# Patient Record
Sex: Male | Born: 1990 | Race: Black or African American | Hispanic: No | Marital: Single | Smoking: Current some day smoker
Health system: Southern US, Community
[De-identification: ages and names within clinical notes are randomized; demographics above are authoritative.]

## PROBLEM LIST (undated history)

## (undated) HISTORY — PX: CIRCUMCISION: SUR203

---
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
## 2010-12-03 ENCOUNTER — Emergency Department (HOSPITAL_COMMUNITY)
Admission: EM | Admit: 2010-12-03 | Discharge: 2010-12-03 | Disposition: A | Discharge status: Home / Self Care | Payer: No Typology Code available for payment source | Attending: Emergency Medicine | Admitting: Emergency Medicine

## 2010-12-03 DIAGNOSIS — M545 Low back pain, unspecified: Secondary | ICD-10-CM | POA: Insufficient documentation

## 2010-12-03 DIAGNOSIS — S335XXA Sprain of ligaments of lumbar spine, initial encounter: Secondary | ICD-10-CM | POA: Insufficient documentation

## 2011-05-30 LAB — DIFFERENTIAL
Basophils Absolute: 0
Basophils Relative: 0
Eosinophils Absolute: 0
Monocytes Relative: 8
Neutro Abs: 7.3 — ABNORMAL HIGH
Neutrophils Relative %: 74 — ABNORMAL HIGH

## 2011-05-30 LAB — POCT I-STAT CREATININE: Creatinine, Ser: 1.5

## 2011-05-30 LAB — CBC
MCHC: 33.2
MCV: 95.9
Platelets: 266
RDW: 13

## 2011-05-30 LAB — I-STAT 8, (EC8 V) (CONVERTED LAB)
Acid-Base Excess: 2
Bicarbonate: 25.8 — ABNORMAL HIGH
Chloride: 104
HCT: 45
Operator id: 272551
pCO2, Ven: 36.9 — ABNORMAL LOW
pH, Ven: 7.452 — ABNORMAL HIGH

## 2012-05-20 ENCOUNTER — Encounter (HOSPITAL_COMMUNITY): Payer: Self-pay | Admitting: Emergency Medicine

## 2012-05-20 ENCOUNTER — Emergency Department (HOSPITAL_COMMUNITY): Payer: BC Managed Care – PPO

## 2012-05-20 ENCOUNTER — Emergency Department (HOSPITAL_COMMUNITY)
Admission: EM | Admit: 2012-05-20 | Discharge: 2012-05-21 | Disposition: A | Discharge status: Home / Self Care | Payer: BC Managed Care – PPO | Attending: Emergency Medicine | Admitting: Emergency Medicine

## 2012-05-20 DIAGNOSIS — S8392XA Sprain of unspecified site of left knee, initial encounter: Secondary | ICD-10-CM

## 2012-05-20 DIAGNOSIS — Y92838 Other recreation area as the place of occurrence of the external cause: Secondary | ICD-10-CM | POA: Insufficient documentation

## 2012-05-20 DIAGNOSIS — M25469 Effusion, unspecified knee: Secondary | ICD-10-CM | POA: Insufficient documentation

## 2012-05-20 DIAGNOSIS — F17200 Nicotine dependence, unspecified, uncomplicated: Secondary | ICD-10-CM | POA: Insufficient documentation

## 2012-05-20 DIAGNOSIS — X500XXA Overexertion from strenuous movement or load, initial encounter: Secondary | ICD-10-CM | POA: Insufficient documentation

## 2012-05-20 DIAGNOSIS — Y9367 Activity, basketball: Secondary | ICD-10-CM | POA: Insufficient documentation

## 2012-05-20 DIAGNOSIS — M25569 Pain in unspecified knee: Secondary | ICD-10-CM | POA: Insufficient documentation

## 2012-05-20 DIAGNOSIS — Y9239 Other specified sports and athletic area as the place of occurrence of the external cause: Secondary | ICD-10-CM | POA: Insufficient documentation

## 2012-05-20 MED ORDER — IBUPROFEN 600 MG PO TABS
600.0000 mg | ORAL_TABLET | Freq: Four times a day (QID) | ORAL | Status: DC | PRN
Start: 2012-05-20 — End: 2017-04-24

## 2012-05-20 MED ORDER — IBUPROFEN 400 MG PO TABS
800.0000 mg | ORAL_TABLET | Freq: Once | ORAL | Status: AC
  Administered 2012-05-20: 800 mg via ORAL
  Filled 2012-05-20: qty 2

## 2012-05-20 MED ORDER — TRAMADOL HCL 50 MG PO TABS: 50.0000 mg | ORAL_TABLET | Freq: Four times a day (QID) | ORAL | Status: DC | PRN

## 2012-05-20 NOTE — ED Provider Notes (Signed)
History     CSN: [id number]  Arrival date & time 05/20/12  2140   First MD Initiated Contact with Patient 05/20/12 2259      Chief Complaint  Patient presents with  . Knee Injury    (Consider location/radiation/quality/duration/timing/severity/associated sxs/prior treatment) HPI  21 year old male presents complaining of left knee injury. Patient reports while playing basketball today he attempted jump when his left knee gave out. Patient fell down to the ground. He denies hitting his head or loss of consciousness. He noticed immediate pain to the medial and lateral aspects of his left knee. He was able to hobble home but unable to bear full weight on his leg. He denies any prior knee injury. He denies any numbness weakness. He describes the pain as a sharp and throbbing sensation that is nonradiating.  History reviewed. No pertinent past medical history.  Past Surgical History  Procedure Date  . Circumcision     History reviewed. No pertinent family history.  History  Substance Use Topics  . Smoking status: Current Some Day Smoker    Types: Cigars  . Smokeless tobacco: Not on file  . Alcohol Use: Yes     occasion      Review of Systems  All other systems reviewed and are negative.    Allergies  Review of patient's allergies indicates no known allergies.  Home Medications  No current outpatient prescriptions on file.  BP 136/85  Pulse 70  Temp 98.5 F (36.9 C) (Oral)  Resp 20  SpO2 98%  Physical Exam  Nursing note and vitals reviewed. Constitutional: He appears well-developed and well-nourished. No distress.  HENT:  Head: Atraumatic.  Eyes: Conjunctivae normal are normal.  Neck: Neck supple.  Cardiovascular:  Pulses:      Dorsalis pedis pulses are 2+ on the left side.  Musculoskeletal:       Left hip: Normal.       Left knee: He exhibits decreased range of motion and swelling. He exhibits no effusion, no ecchymosis, no deformity, no laceration, no  erythema, normal alignment, no LCL laxity, normal patellar mobility, no bony tenderness and normal meniscus. tenderness found. Medial joint line and lateral joint line tenderness noted. No patellar tendon tenderness noted.       Left ankle: Normal.  Neurological: He is alert.  Skin: Skin is warm. No rash noted.  Psychiatric: He has a normal mood and affect.    ED Course  Procedures (including critical care time)  Labs Reviewed - No data to display Dg Knee Complete 4 Views Left  05/20/2012  *RADIOLOGY REPORT*  Clinical Data: Pain post trauma  LEFT KNEE - COMPLETE 4+ VIEW  Comparison: None.  Findings:  Frontal, lateral, and bilateral oblique views were obtained.  There is no fracture or dislocation.  There is a joint effusion.  Joint spaces appear intact.  No erosive change.  IMPRESSION: Joint effusion.  No fracture or dislocation.   Original Report Authenticated By: Arvin Collard. WOODRUFF III, M.D.      No diagnosis found.  1. L knee injury  MDM  Left knee injury. Knee is tender and wants to the touch. Decreased range of motion due to pain. Normal anterior and posterior drawer test. No laxity. No hip or ankle involvement. X-ray negative for fracture or dislocation. Patient will receive knee sleeves and crutches for support. Rice therapy discussed. Ortho referral given. Pain medication given.  I suspect meniscal tear vs. sprain  BP 136/85  Pulse 70  Temp 98.5 F (  36.9 C) (Oral)  Resp 20  SpO2 98%  Nursing notes reviewed and considered in documentation  Previous records reviewed and considered  All labs/vitals reviewed and considered  xrays reviewed and considered       Fayrene Helper, PA-C 05/20/12 2346

## 2012-05-20 NOTE — Progress Notes (Signed)
Orthopedic Tech Progress Note Patient Details:  Craig Wolf 11-06-1990 [id number]  Ortho Devices Type of Ortho Device: Crutches;Knee Sleeve   [name] 05/20/2012, 11:31 PM

## 2012-05-20 NOTE — ED Notes (Addendum)
Pt reports going to lay up while playing basketball and twisted L knee; now having pain when standing--also noted bump to L lateral knee; currently denies pain when sitting

## 2012-05-22 NOTE — ED Provider Notes (Signed)
Medical screening examination/treatment/procedure(s) were performed by non-physician practitioner and as supervising physician I was immediately available for consultation/collaboration.   Carleene Cooper III, MD 05/22/12 [phone]

## 2014-05-10 IMAGING — CR DG KNEE COMPLETE 4+V*L*
4 series · 4 of 4 positions shown · non-contrast
Comparison: None.

CLINICAL DATA: Pain post trauma

LEFT KNEE - COMPLETE 4+ VIEW

[t knee ap left]
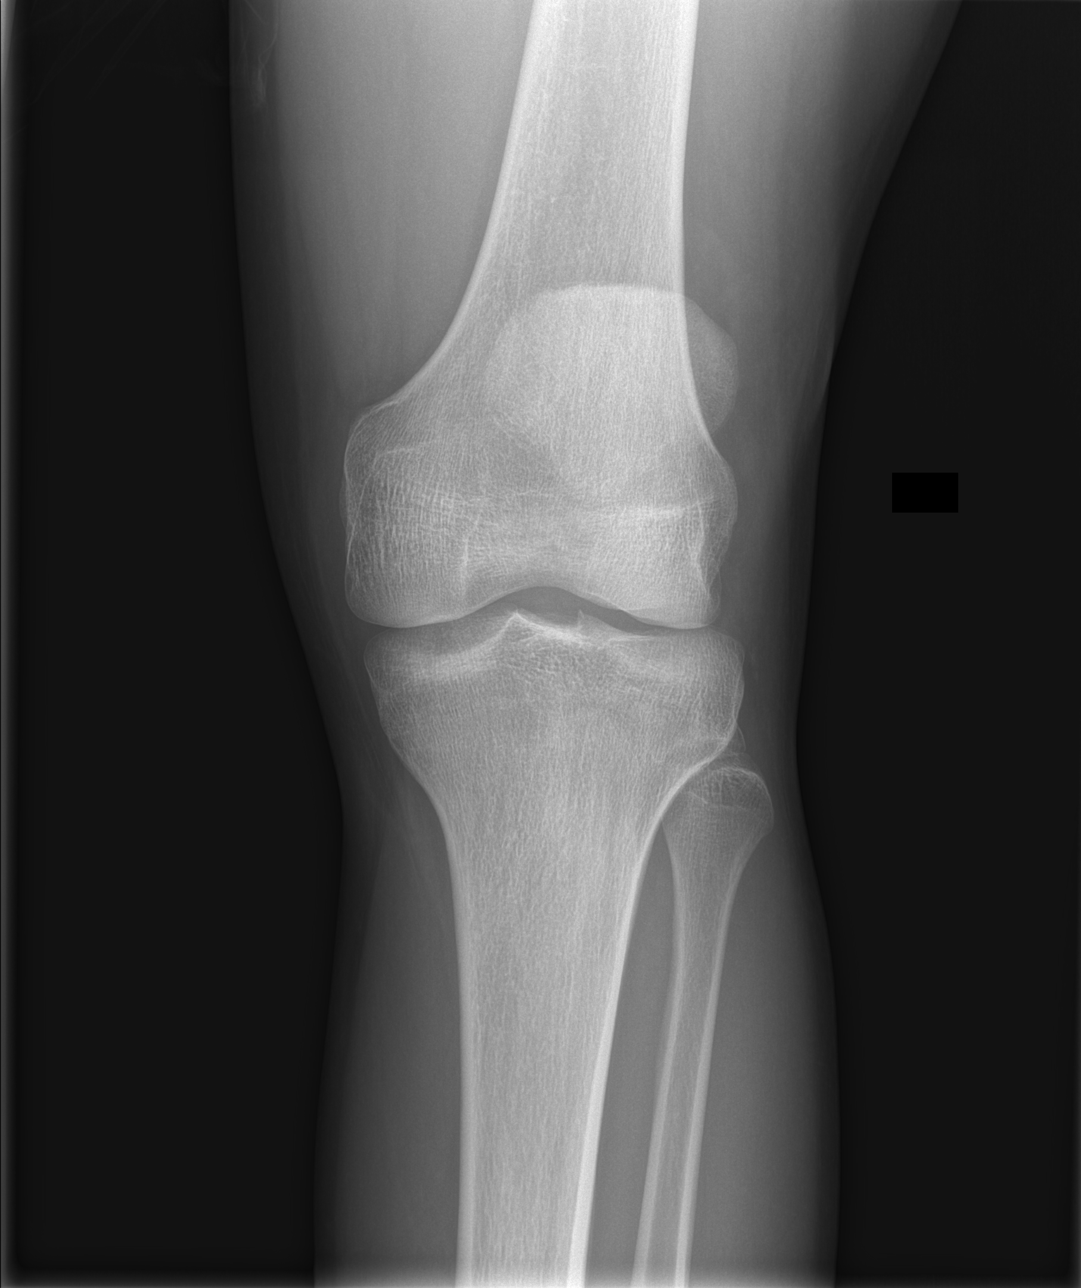

[t knee obl left (1 of 2)]
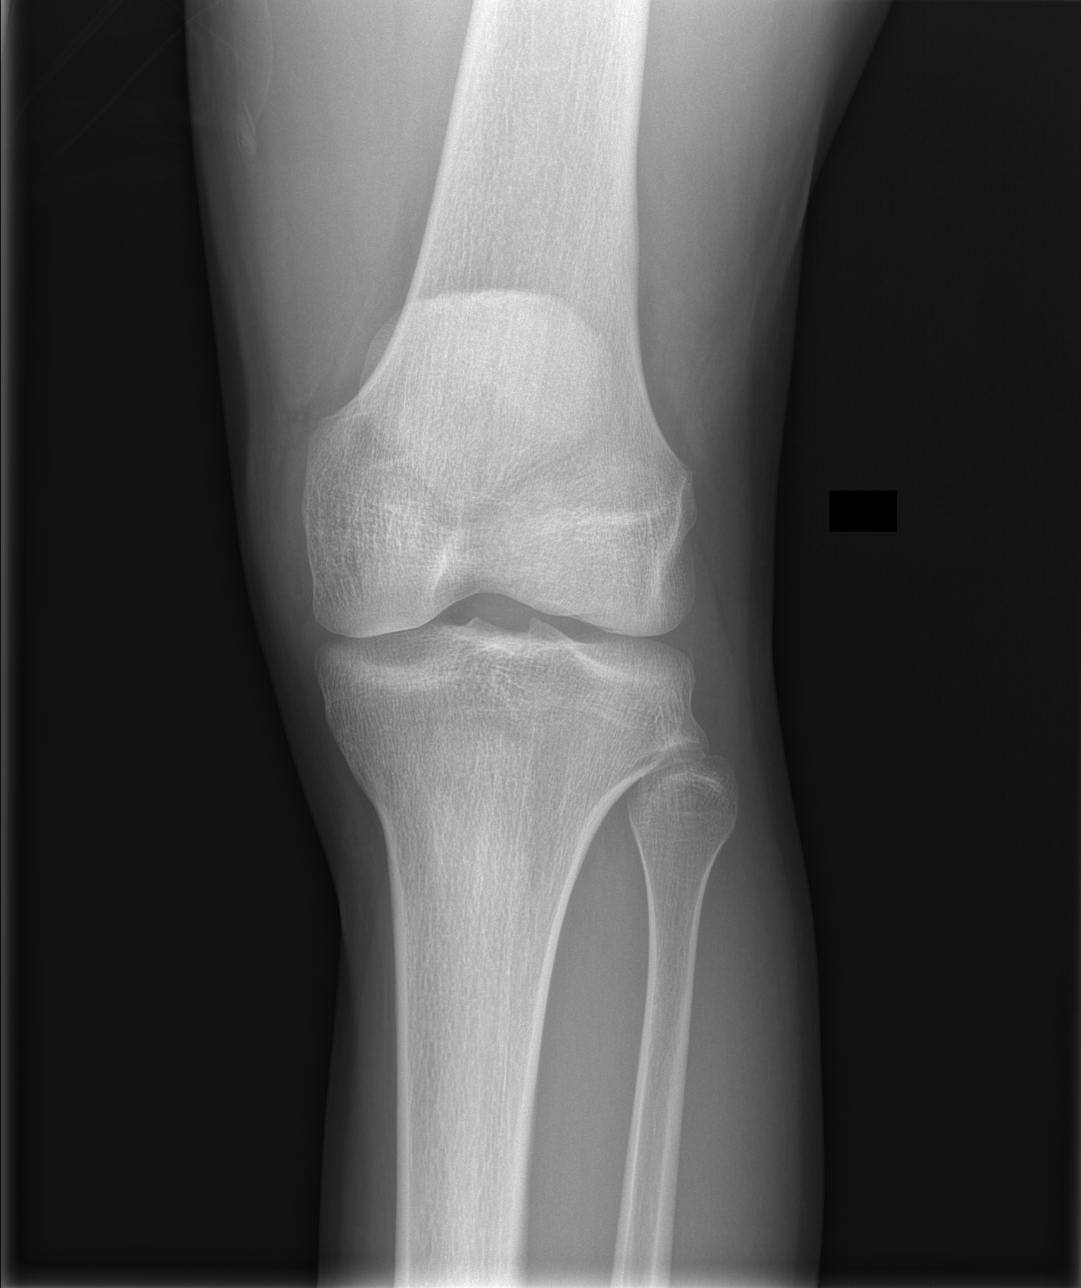

[t knee obl left (2 of 2)]
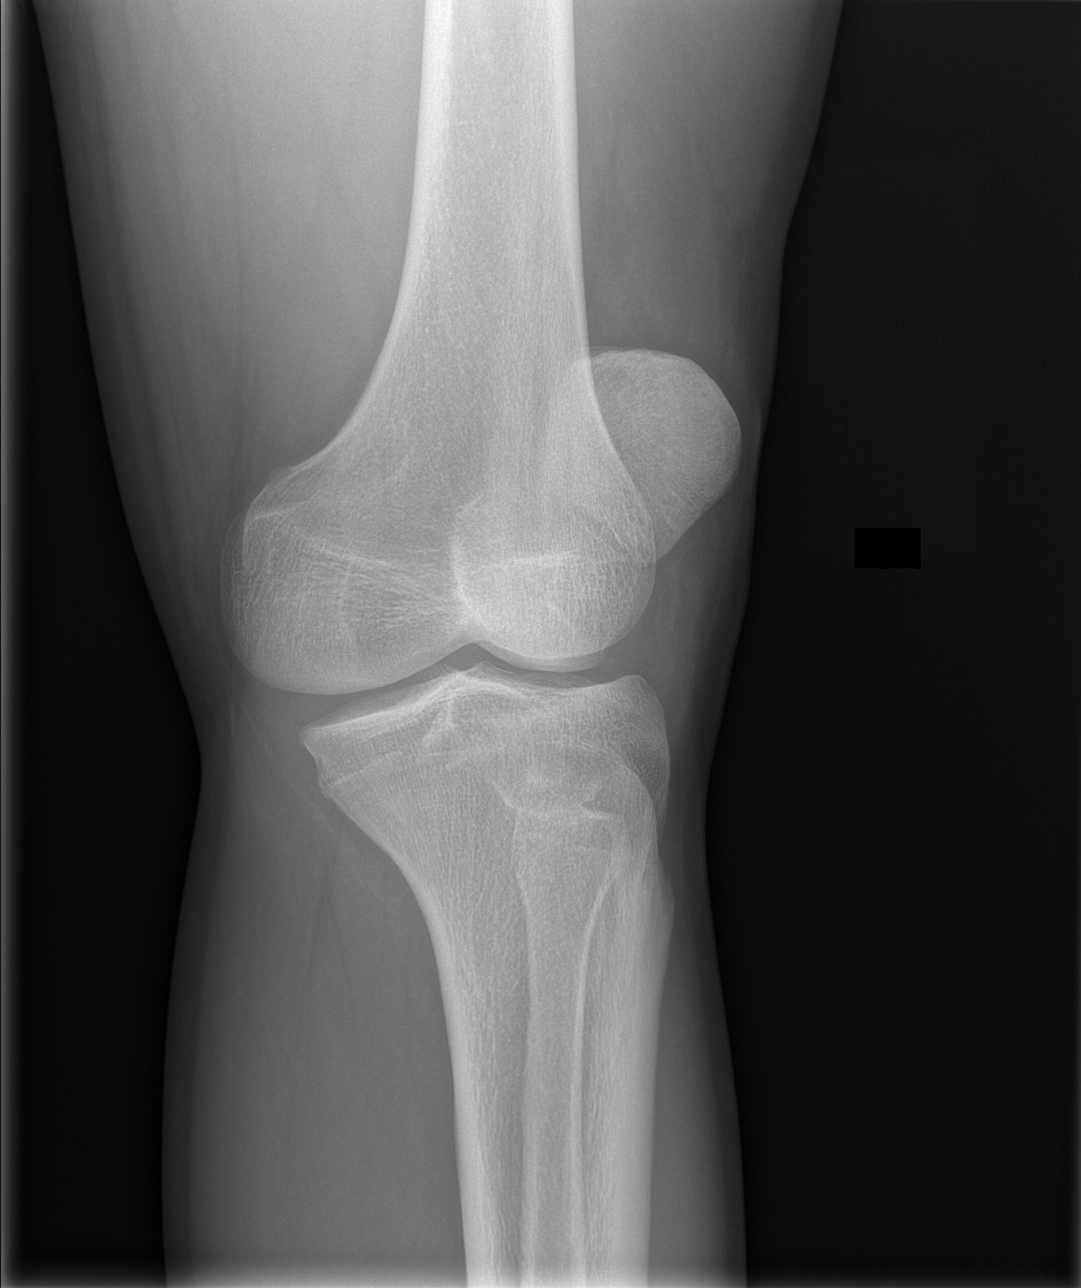

[t knee lat left]
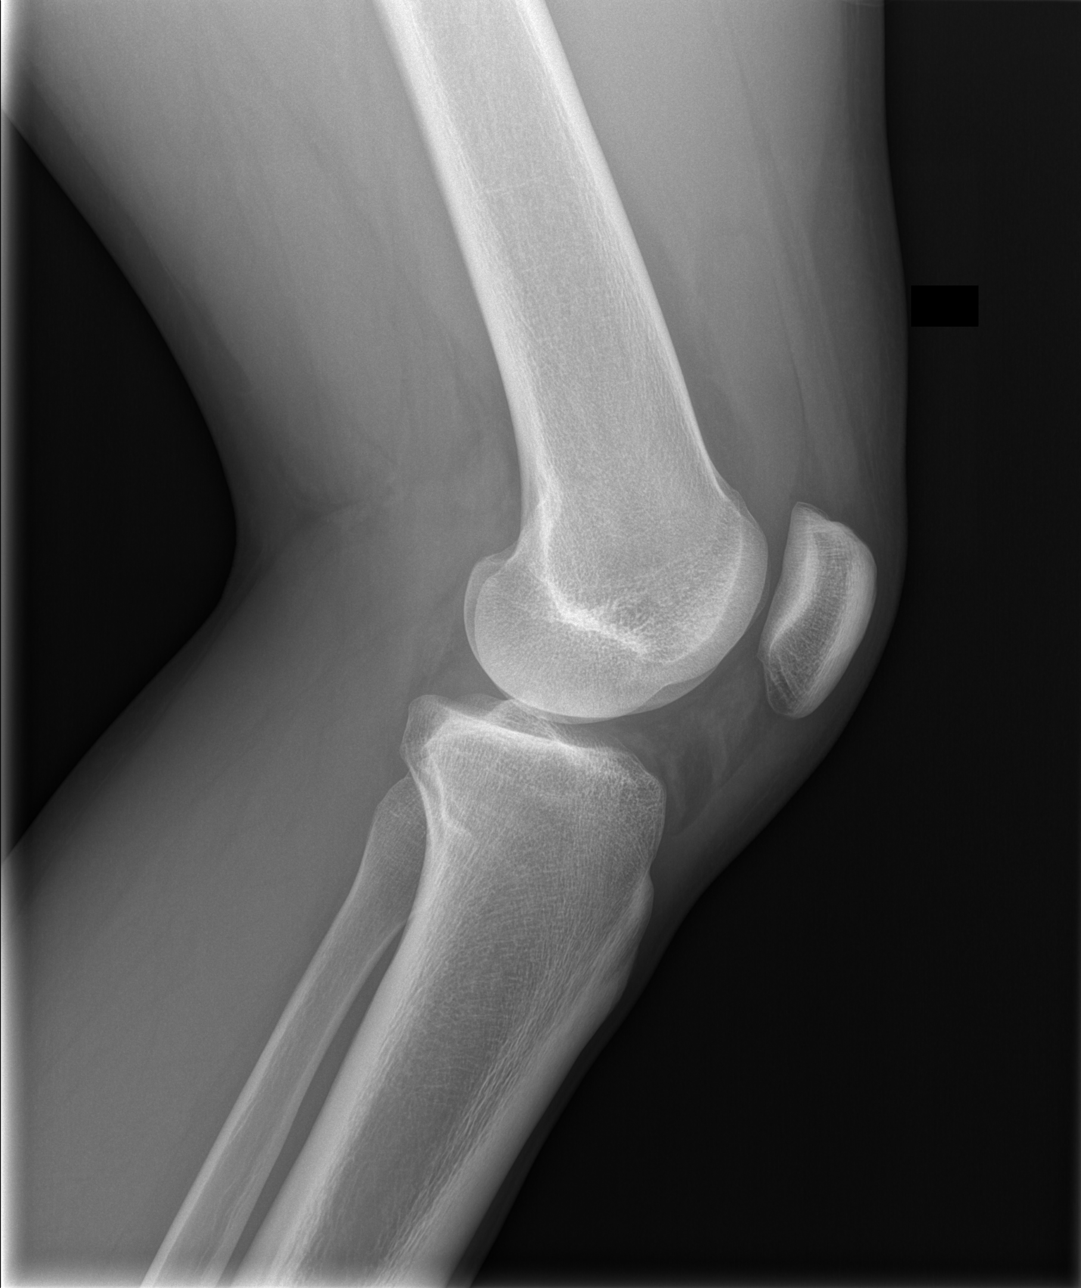

[4 of 4 positions shown; findings below may reference images not displayed]

FINDINGS: Frontal, lateral, and bilateral oblique views were
obtained.  There is no fracture or dislocation.  There is a joint
effusion.  Joint spaces appear intact.  No erosive change.
IMPRESSION: Joint effusion.  No fracture or dislocation.

## 2017-04-24 ENCOUNTER — Encounter (HOSPITAL_COMMUNITY): Payer: Self-pay | Admitting: Emergency Medicine

## 2017-04-24 ENCOUNTER — Ambulatory Visit (HOSPITAL_COMMUNITY)
Admission: EM | Admit: 2017-04-24 | Discharge: 2017-04-24 | Disposition: A | Discharge status: Home / Self Care | Payer: 59

## 2017-04-24 DIAGNOSIS — R030 Elevated blood-pressure reading, without diagnosis of hypertension: Secondary | ICD-10-CM

## 2017-04-24 DIAGNOSIS — J069 Acute upper respiratory infection, unspecified: Secondary | ICD-10-CM | POA: Diagnosis not present

## 2017-04-24 NOTE — Discharge Instructions (Signed)
Recommend take Sudafed  or Dayquil every 6 hours as needed for congestion. May take Nyquil at night to help sleep. Increase fluid intake to help loosen up mucus. Continue to monitor blood pressure- 2-3X a week. If BP is >140/>90, return for recheck. Also follow-up in 4 to 5 days if not improving.

## 2017-04-24 NOTE — ED Triage Notes (Signed)
Complains of head stuffiness, patient has a runny nose, phlegm in the back of throat, minimal cough, increase clearing of throat.

## 2017-04-24 NOTE — ED Provider Notes (Signed)
[facility]    CSN: [id number] Arrival date & time: 04/24/17  1003     History   Chief Complaint Chief Complaint  Patient presents with  . URI    HPI Craig Wolf is a 26 y.o. male.   26 year old male presents with nasal congestion, post nasal drainage, irritated throat and slight cough that started yesterday. Also having some chills but no fever or GI symptoms. Has taken Airborne and increased vitamin C intake with minimal success. Daughter recently ill with slight nasal drainage. Otherwise no chronic health issues. Takes no daily medication. Blood pressure has never been elevated before- concerned it is elevated today due to stress and recent weight gain.    The history is provided by the patient.    History reviewed. No pertinent past medical history.  There are no active problems to display for this patient.   Past Surgical History:  Procedure Laterality Date  . CIRCUMCISION         Home Medications    Prior to Admission medications   Not on File    Family History No family history on file.  Social History Social History  Substance Use Topics  . Smoking status: Current Some Day Smoker    Types: Cigars  . Smokeless tobacco: Not on file  . Alcohol use Yes     Comment: occasion     Allergies   Patient has no known allergies.   Review of Systems Review of Systems  Constitutional: Positive for chills and fatigue. Negative for activity change, appetite change and fever.  HENT: Positive for congestion, postnasal drip and rhinorrhea. Negative for ear discharge, ear pain, facial swelling, nosebleeds, sinus pain, sinus pressure, sneezing and sore throat.   Eyes: Negative for discharge, redness and itching.  Respiratory: Positive for cough. Negative for chest tightness, shortness of breath and wheezing.   Cardiovascular: Negative for chest pain.  Gastrointestinal: Negative for abdominal pain, diarrhea, nausea and vomiting.  Musculoskeletal:  Negative for arthralgias, back pain, myalgias, neck pain and neck stiffness.  Skin: Negative for rash and wound.  Allergic/Immunologic: Negative for immunocompromised state.  Neurological: Negative for dizziness, seizures, syncope, weakness, light-headedness, numbness and headaches.  Hematological: Positive for adenopathy. Does not bruise/bleed easily.     Physical Exam Triage Vital Signs ED Triage Vitals  Enc Vitals Group     BP 04/24/17 1018 (!) 140/93     Pulse Rate 04/24/17 1018 89     Resp 04/24/17 1018 18     Temp 04/24/17 1018 98.2 F (36.8 C)     Temp Source 04/24/17 1018 Oral     SpO2 04/24/17 1018 98 %     Weight --      Height --      Head Circumference --      Peak Flow --      Pain Score 04/24/17 1022 5     Pain Loc --      Pain Edu? --      Excl. in GC? --    No data found.   Updated Vital Signs BP (!) 140/93 (BP Location: Left Arm) Comment: reported BP to nurse Ashleigh Shambley  Pulse 89   Temp 98.2 F (36.8 C) (Oral)   Resp 18   SpO2 98%   Visual Acuity Right Eye Distance:   Left Eye Distance:   Bilateral Distance:    Right Eye Near:   Left Eye Near:    Bilateral Near:     Physical  Exam  Constitutional: He is oriented to person, place, and time. He appears well-developed and well-nourished. No distress.  HENT:  Head: Normocephalic and atraumatic.  Right Ear: Hearing, tympanic membrane, external ear and ear canal normal.  Left Ear: Hearing, tympanic membrane, external ear and ear canal normal.  Nose: Mucosal edema and rhinorrhea present. Right sinus exhibits no maxillary sinus tenderness and no frontal sinus tenderness. Left sinus exhibits no maxillary sinus tenderness and no frontal sinus tenderness.  Mouth/Throat: Uvula is midline and mucous membranes are normal. Posterior oropharyngeal erythema present.  Eyes: Conjunctivae and EOM are normal.  Neck: Normal range of motion. Neck supple.  Cardiovascular: Normal rate, regular rhythm and normal  heart sounds.   No murmur heard. Pulmonary/Chest: Effort normal and breath sounds normal. No respiratory distress. He has no decreased breath sounds. He has no wheezes. He has no rhonchi.  Lymphadenopathy:    He has no cervical adenopathy.  Neurological: He is alert and oriented to person, place, and time.  Skin: Skin is warm and dry. No rash noted.  Psychiatric: He has a normal mood and affect. His behavior is normal. Judgment and thought content normal.     UC Treatments / Results  Labs (all labs ordered are listed, but only abnormal results are displayed) Labs Reviewed - No data to display  EKG  EKG Interpretation None       Radiology No results found.  Procedures Procedures (including critical care time)  Medications Ordered in UC Medications - No data to display   Initial Impression / Assessment and Plan / UC Course  I have reviewed the triage vital signs and the nursing notes.  Pertinent labs & imaging results that were available during my care of the patient were reviewed by me and considered in my medical decision making (see chart for details).    Discussed that he has a viral illness (URI). Recommend Dayquil or Sudafed  every 6 to 8 hours as needed for congestion. May take Nyquil at night to help with sleep. Increase fluid intake to help loosen up mucus. Note written for work. Continue to monitor blood pressure. May be elevated due to illness, caffeine, stress, and/or smoking. Encouraged to check BP 2-3 times a week. If BP is >140/>90, return for recheck. Also follow-up in 4 to 5 days if current URI symptoms are not improving.    Final Clinical Impressions(s) / UC Diagnoses   Final diagnoses:  Viral upper respiratory tract infection  Elevated blood-pressure reading, without diagnosis of hypertension    New Prescriptions Discharge Medication List as of 04/24/2017 10:43 AM       Controlled Substance Prescriptions [place] Controlled Substance Registry  consulted? Not applicable.    Sudie Grumbling, NP 04/24/17 [phone]

## 2019-03-22 ENCOUNTER — Emergency Department (HOSPITAL_COMMUNITY)
Admission: EM | Admit: 2019-03-22 | Discharge: 2019-03-22 | Disposition: A | Discharge status: Home / Self Care | Payer: 59 | Attending: Emergency Medicine | Admitting: Emergency Medicine

## 2019-03-22 ENCOUNTER — Other Ambulatory Visit: Payer: Self-pay

## 2019-03-22 DIAGNOSIS — F17290 Nicotine dependence, other tobacco product, uncomplicated: Secondary | ICD-10-CM | POA: Insufficient documentation

## 2019-03-22 DIAGNOSIS — J039 Acute tonsillitis, unspecified: Secondary | ICD-10-CM | POA: Insufficient documentation

## 2019-03-22 MED ORDER — PREDNISONE 10 MG PO TABS: 20.0000 mg | ORAL_TABLET | Freq: Two times a day (BID) | ORAL | 0 refills | Status: AC

## 2019-03-22 MED ORDER — CEPHALEXIN 500 MG PO CAPS: 500.0000 mg | ORAL_CAPSULE | Freq: Four times a day (QID) | ORAL | 0 refills | Status: AC

## 2019-03-22 NOTE — ED Provider Notes (Signed)
[facility] Provider Note   CSN: [id number] Arrival date & time: 03/22/19  0534     History   Chief Complaint No chief complaint on file.   HPI Craig Wolf is a 28 y.o. male.     Patient is a 28 year old male presenting with complaints of throat pain.  This is been present for the past week.  He describes discomfort to the back of his throat radiating to his left ear.  He denies any fevers or chills.  Patient missed several days of work due to being sick.  He did have a COVID test performed last week which was negative.  He denies any ill contacts.  The history is provided by the patient.    No past medical history on file.  There are no active problems to display for this patient.   Past Surgical History:  Procedure Laterality Date  . CIRCUMCISION          Home Medications    Prior to Admission medications   Not on File    Family History No family history on file.  Social History Social History   Tobacco Use  . Smoking status: Current Some Day Smoker    Types: Cigars  Substance Use Topics  . Alcohol use: Yes    Comment: occasion  . Drug use: No     Allergies   Patient has no known allergies.   Review of Systems Review of Systems  All other systems reviewed and are negative.    Physical Exam Updated Vital Signs BP (!) 143/115   Pulse (!) 103   Temp 98.5 F (36.9 C) (Oral)   Resp 19   Ht 6\' 1"  (1.854 m)   Wt 93 kg   SpO2 96%   BMI 27.05 kg/m   Physical Exam Vitals signs and nursing note reviewed.  Constitutional:      General: He is not in acute distress.    Appearance: He is well-developed. He is not diaphoretic.  HENT:     Head: Normocephalic and atraumatic.     Mouth/Throat:     Mouth: Mucous membranes are moist.     Pharynx: Posterior oropharyngeal erythema present. No oropharyngeal exudate.  Neck:     Musculoskeletal: Normal range of motion and neck supple.  Cardiovascular:   Rate and Rhythm: Normal rate and regular rhythm.     Heart sounds: No murmur. No friction rub.  Pulmonary:     Effort: Pulmonary effort is normal. No respiratory distress.     Breath sounds: Normal breath sounds. No wheezing or rales.  Abdominal:     General: Bowel sounds are normal. There is no distension.     Palpations: Abdomen is soft.     Tenderness: There is no abdominal tenderness.  Musculoskeletal: Normal range of motion.  Skin:    General: Skin is warm and dry.  Neurological:     Mental Status: He is alert and oriented to person, place, and time.     Coordination: Coordination normal.      ED Treatments / Results  Labs (all labs ordered are listed, but only abnormal results are displayed) Labs Reviewed - No data to display  EKG None  Radiology No results found.  Procedures Procedures (including critical care time)  Medications Ordered in ED Medications - No data to display   Initial Impression / Assessment and Plan / ED Course  I have reviewed the triage vital signs and the nursing notes.  Pertinent  labs & imaging results that were available during my care of the patient were reviewed by me and considered in my medical decision making (see chart for details).  We will treat for tonsillitis with Keflex and prednisone.  To return as needed if he worsens.  Final Clinical Impressions(s) / ED Diagnoses   Final diagnoses:  None    ED Discharge Orders    None       Geoffery Lyonselo, Bernardo Brayman, MD 03/22/19 [phone]

## 2019-03-22 NOTE — ED Triage Notes (Addendum)
Pt coming POV with complaints of left sided throat/ear pain that increases when he swallows. Airway patent. Been going on for about a week and has been causing pt difficulty with eating/drinking due to the pain

## 2019-03-22 NOTE — Discharge Instructions (Addendum)
Begin taking prednisone and Keflex as prescribed.  Drink plenty of fluids and get plenty of rest.  Follow-up with your primary doctor if symptoms or not improving in the next week.

## 2019-03-22 NOTE — ED Notes (Signed)
Discharge instructions discussed with pt. Pt verbalized understanding no questions at this time.  

## 2019-03-22 NOTE — ED Notes (Signed)
ED Provider at bedside. 

## 2019-12-16 ENCOUNTER — Ambulatory Visit: Payer: Self-pay | Attending: Internal Medicine

## 2019-12-16 DIAGNOSIS — Z23 Encounter for immunization: Secondary | ICD-10-CM

## 2019-12-16 NOTE — Progress Notes (Signed)
   Covid-19 Vaccination Clinic  Name:  Craig Wolf    MRN: [id number] DOB: [birth year]  12/16/2019  [name] was observed post Covid-19 immunization for 15 minutes without incident. He was provided with Vaccine Information Sheet and instruction to access the V-Safe system.   [name] was instructed to call 911 with any severe reactions post vaccine: Marland Kitchen Difficulty breathing  . Swelling of face and throat  . A fast heartbeat  . A bad rash all over body  . Dizziness and weakness   Immunizations Administered    Name Date Dose VIS Date Route   Pfizer COVID-19 Vaccine 12/16/2019  8:14 AM 0.3 mL 10/13/2018 Intramuscular   Manufacturer: [employer], [employer]   Lot: [id number]   NDC: [address]-3

## 2020-01-10 ENCOUNTER — Ambulatory Visit: Payer: Self-pay | Attending: Internal Medicine

## 2020-01-10 DIAGNOSIS — Z23 Encounter for immunization: Secondary | ICD-10-CM

## 2020-01-10 NOTE — Progress Notes (Signed)
   Covid-19 Vaccination Clinic  Name:  Craig Wolf    MRN: [id number] DOB: February 12, 1991  01/10/2020  [name] was observed post Covid-19 immunization for 15 minutes without incident. He was provided with Vaccine Information Sheet and instruction to access the V-Safe system.   [name] was instructed to call 911 with any severe reactions post vaccine: Marland Kitchen Difficulty breathing  . Swelling of face and throat  . A fast heartbeat  . A bad rash all over body  . Dizziness and weakness   Immunizations Administered    Name Date Dose VIS Date Route   Pfizer COVID-19 Vaccine 01/10/2020  8:19 AM 0.3 mL 10/13/2018 Intramuscular   Manufacturer: [employer], [employer]   Lot: [id number]   NDC: [address]-8

## 2020-01-10 NOTE — Progress Notes (Signed)
   Covid-19 Vaccination Clinic  Name:  Craig Wolf    MRN: [id number] DOB: 03/29/1991  01/10/2020  [name] was observed post Covid-19 immunization for 15 minutes without incident. He was provided with Vaccine Information Sheet and instruction to access the V-Safe system.   [name] was instructed to call 911 with any severe reactions post vaccine: . Difficulty breathing  . Swelling of face and throat  . A fast heartbeat  . A bad rash all over body  . Dizziness and weakness   Immunizations Administered    Name Date Dose VIS Date Route   Pfizer COVID-19 Vaccine 01/10/2020  8:19 AM 0.3 mL 10/13/2018 Intramuscular   Manufacturer: Pfizer[employer] Inc   Lot: [id number]   NDC: [address]-2
